# Patient Record
Sex: Male | Born: 1975 | Race: White | Marital: Single | Smoking: Never smoker
Health system: Southern US, Community
[De-identification: ages and names within clinical notes are randomized; demographics above are authoritative.]

## PROBLEM LIST (undated history)

## (undated) DIAGNOSIS — K37 Unspecified appendicitis: Secondary | ICD-10-CM

## (undated) DIAGNOSIS — R569 Unspecified convulsions: Secondary | ICD-10-CM

## (undated) HISTORY — PX: APPENDECTOMY: SHX54

---

## 2015-08-24 ENCOUNTER — Encounter: Payer: Self-pay | Admitting: Emergency Medicine

## 2015-08-24 ENCOUNTER — Emergency Department
Admission: EM | Admit: 2015-08-24 | Discharge: 2015-08-24 | Disposition: A | Discharge status: Home / Self Care | Payer: Self-pay | Attending: Emergency Medicine | Admitting: Emergency Medicine

## 2015-08-24 ENCOUNTER — Other Ambulatory Visit: Payer: Self-pay

## 2015-08-24 ENCOUNTER — Emergency Department: Payer: Self-pay

## 2015-08-24 DIAGNOSIS — R079 Chest pain, unspecified: Secondary | ICD-10-CM | POA: Insufficient documentation

## 2015-08-24 HISTORY — DX: Unspecified appendicitis: K37

## 2015-08-24 HISTORY — DX: Unspecified convulsions: R56.9

## 2015-08-24 LAB — CBC
HCT: 45.5 % (ref 40.0–52.0)
Hemoglobin: 15.8 g/dL (ref 13.0–18.0)
MCH: 31 pg (ref 26.0–34.0)
MCHC: 34.6 g/dL (ref 32.0–36.0)
MCV: 89.6 fL (ref 80.0–100.0)
PLATELETS: 176 10*3/uL (ref 150–440)
RBC: 5.08 MIL/uL (ref 4.40–5.90)
RDW: 13.6 % (ref 11.5–14.5)
WBC: 9 10*3/uL (ref 3.8–10.6)

## 2015-08-24 LAB — BASIC METABOLIC PANEL
Anion gap: 7 (ref 5–15)
BUN: 14 mg/dL (ref 6–20)
CHLORIDE: 106 mmol/L (ref 101–111)
CO2: 26 mmol/L (ref 22–32)
CREATININE: 0.96 mg/dL (ref 0.61–1.24)
Calcium: 9.1 mg/dL (ref 8.9–10.3)
GFR calc Af Amer: >60 mL/min (ref >60)
GFR calc non Af Amer: >60 mL/min (ref >60)
GLUCOSE: 114 mg/dL — AB (ref 65–99)
Potassium: 4.1 mmol/L (ref 3.5–5.1)
SODIUM: 139 mmol/L (ref 135–145)

## 2015-08-24 LAB — TROPONIN I
Troponin I: <0.03 ng/mL (ref <0.031)
Troponin I: <0.03 ng/mL (ref <0.031)

## 2015-08-24 MED ORDER — ASPIRIN 81 MG PO CHEW
81.0000 mg | CHEWABLE_TABLET | Freq: Once | ORAL | Status: AC
  Administered 2015-08-24: 81 mg via ORAL
  Filled 2015-08-24: qty 1

## 2015-08-24 NOTE — ED Provider Notes (Signed)
[facility] Emergency Department Provider Note  ____________________________________________  Time seen: Approximately 12:31 PM  I have reviewed the triage vital signs and the nursing notes.   HISTORY  Chief Complaint Chest Pain    HPI Rachit Grim is a 40 y.o. male patient reports 3 or 4 weeks ago he had an episode of discomfort in his lower chest pressure-like little bit in the left shoulder and left arm lasted a few days and one away to come and go in intensity started again yesterday got better woke up with it this morning a little bit worse can feel some palpitations got better when he got here. Was not worse with movement of his body is not worse with exercise he had no sweating nausea or shortness of breath no pleuritic chest pain lasted a good part of the day yesterday and for several hours this morning. Mild in nature vague kind of pressure feeling not sharp or stabbing very heavy.  Past Medical History  Diagnosis Date  . Seizures (HCC)   . Appendicitis     There are no active problems to display for this patient.   Past Surgical History  Procedure Laterality Date  . Appendectomy      Current Outpatient Rx  Name  Route  Sig  Dispense  Refill  . ibuprofen (ADVIL,MOTRIN) 200 MG tablet   Oral   Take 400-600 mg by mouth every 6 (six) hours as needed for mild pain or moderate pain.           Allergies Review of patient's allergies indicates no known allergies.  History reviewed. No pertinent family history.  Social History Social History  Substance Use Topics  . Smoking status: Never Smoker   . Smokeless tobacco: Never Used  . Alcohol Use: No   family history;  Mother has had cardiac disease wife died of cardiac arrest  Review of Systems Constitutional: No fever/chills Eyes: No visual changes. ENT: No sore throat. Cardiovascular: See history of present illness Respiratory: Denies shortness of breath. Gastrointestinal: No  abdominal pain.  No nausea, no vomiting.  No diarrhea.  No constipation. Genitourinary: Negative for dysuria. Musculoskeletal: Negative for back pain. Skin: Negative for rash. Neurological: Negative for headaches, focal weakness or numbness.  10-point ROS otherwise negative.  ____________________________________________   PHYSICAL EXAM:  VITAL SIGNS: ED Triage Vitals  Enc Vitals Group     BP 08/24/15 1007 144/86 mmHg     Pulse Rate 08/24/15 1007 88     Resp 08/24/15 1007 18     Temp 08/24/15 1007 97.4 F (36.3 C)     Temp Source 08/24/15 1007 Oral     SpO2 08/24/15 1007 98 %     Weight 08/24/15 1007 160 lb (72.576 kg)     Height 08/24/15 1007  (1.905 m)     Head Cir --      Peak Flow --      Pain Score 08/24/15 1014 4     Pain Loc --      Pain Edu? --      Excl. in GC? --     Constitutional: Alert and oriented. Well appearing and in no acute distress. Eyes: Conjunctivae are normal. PERRL. EOMI. Head: Atraumatic. Nose: No congestion/rhinnorhea. Mouth/Throat: Mucous membranes are moist.  Oropharynx non-erythematous. Neck: No stridor.   Cardiovascular: Normal rate, regular rhythm. Grossly normal heart sounds.  Good peripheral circulation. Respiratory: Normal respiratory effort.  No retractions. Lungs CTAB. Gastrointestinal: Soft and nontender. No distention. No  abdominal bruits. No CVA tenderness. Musculoskeletal: No lower extremity tenderness nor edema.  No joint effusions. Neurologic:  Normal speech and language. No gross focal neurologic deficits are appreciated.  Skin:  Skin is warm, dry and intact. No rash noted.   ____________________________________________   LABS (all labs ordered are listed, but only abnormal results are displayed)  Labs Reviewed  BASIC METABOLIC PANEL - Abnormal; Notable for the following:    Glucose, Bld 114 (*)    All other components within normal limits  CBC  TROPONIN I  TROPONIN I    ____________________________________________  EKG  EKG read and interpreted by me normal sinus rhythm rate of 99 normal axis. His reading possible left atrial enlargement which could be true EKG looks otherwise normal ____________________________________________  RADIOLOGY  Chest x-ray read by radiology and reviewed by me is normal ____________________________________________   PROCEDURES    ____________________________________________   INITIAL IMPRESSION / ASSESSMENT AND PLAN / ED COURSE  Pertinent labs & imaging results that were available during my care of the patient were reviewed by me and considered in my medical decision making (see chart for details).   ____________________________________________   FINAL CLINICAL IMPRESSION(S) / ED DIAGNOSES  Final diagnoses:  Chest pain, unspecified chest pain type      Arnaldo Natal, MD 08/26/15 [phone]

## 2015-08-24 NOTE — ED Notes (Signed)
Pt c/o chest pain that started last night. Pt states pain and tingling radiates down his L arm at this time. Pt states pain with palpation "in some places". NAD noted at this time. Skin warm, dry and intact. Pt alert and oriented at this time.

## 2015-08-24 NOTE — ED Notes (Signed)
Pt not in ED room. Will assess pt upon return from xray.

## 2015-08-24 NOTE — ED Notes (Signed)
Report received 

## 2015-08-25 ENCOUNTER — Telehealth: Payer: Self-pay

## 2015-08-25 NOTE — Telephone Encounter (Signed)
l mom to schedule ED f/u 

## 2015-08-25 NOTE — Telephone Encounter (Signed)
This encounter was created in error - please disregard.

## 2018-02-16 ENCOUNTER — Emergency Department
Admission: EM | Admit: 2018-02-16 | Discharge: 2018-02-17 | Disposition: A | Discharge status: Home / Self Care | Payer: Self-pay | Attending: Emergency Medicine | Admitting: Emergency Medicine

## 2018-02-16 ENCOUNTER — Emergency Department: Payer: Self-pay

## 2018-02-16 DIAGNOSIS — R0789 Other chest pain: Secondary | ICD-10-CM | POA: Insufficient documentation

## 2018-02-16 LAB — CBC
HEMATOCRIT: 47.1 % (ref 40.0–52.0)
Hemoglobin: 16.4 g/dL (ref 13.0–18.0)
MCH: 31.7 pg (ref 26.0–34.0)
MCHC: 34.8 g/dL (ref 32.0–36.0)
MCV: 91.2 fL (ref 80.0–100.0)
PLATELETS: 209 10*3/uL (ref 150–440)
RBC: 5.17 MIL/uL (ref 4.40–5.90)
RDW: 13.1 % (ref 11.5–14.5)
WBC: 8.1 10*3/uL (ref 3.8–10.6)

## 2018-02-16 LAB — BASIC METABOLIC PANEL
Anion gap: 8 (ref 5–15)
BUN: 20 mg/dL (ref 6–20)
CHLORIDE: 105 mmol/L (ref 98–111)
CO2: 25 mmol/L (ref 22–32)
Calcium: 9.3 mg/dL (ref 8.9–10.3)
Creatinine, Ser: 1.1 mg/dL (ref 0.61–1.24)
GFR calc non Af Amer: >60 mL/min (ref >60)
Glucose, Bld: 102 mg/dL — ABNORMAL HIGH (ref 70–99)
POTASSIUM: 3.5 mmol/L (ref 3.5–5.1)
Sodium: 138 mmol/L (ref 135–145)

## 2018-02-16 LAB — TROPONIN I: Troponin I: <0.03 ng/mL (ref <0.03)

## 2018-02-16 NOTE — ED Triage Notes (Signed)
Patient c/o left chest chest pain radiating to left arm and back X 1 week. Patient c/o fatigue and SOB, denies any other accompanying symptoms.

## 2018-02-17 ENCOUNTER — Other Ambulatory Visit: Payer: Self-pay

## 2018-02-17 LAB — TROPONIN I

## 2018-02-17 NOTE — Discharge Instructions (Signed)
Is unclear why you have been having chest pain, certainly some of it could be anxiety but we cannot diagnose you with that in the emergency room.  We do feel that you have multiple stressors and we do advise you follow with someone to talk about that.  We have given you a referral for that.  However, we are more preoccupied by your chest pain.  You would prefer not to be admitted to the hospital which is certainly your choice however does limit our ability to further evaluate this.  Is been going on now for several years and therefore I think it is time to have it definitively analyzed by cardiology.  As you would prefer not to be admitted we will do this as an outpatient, please follow closely with cardiologist on Monday for stress test.  Return to the emergency room in the meantime if you change your mind about admission or have any new or worrisome symptoms including chest pain that is worse, shortness of breath or you have any other concerns.  While your  work-up in the emergency room is reassuring, and your pain is been going on for a long time, there is no way we can definitively tell you that you not having heart disease, therefore, if you have any change in symptoms or you change your mind about admission we would like you to return immediately to the ER we will be happy to take care of you at that time.  Significant symptoms call 911.  Otherwise, do not feel to follow-up with cardiology please

## 2018-02-17 NOTE — ED Provider Notes (Addendum)
[facility] Emergency Department Provider Note  ____________________________________________   I have reviewed the triage vital signs and the nursing notes. Where available I have reviewed prior notes and, if possible and indicated, outside hospital notes.    HISTORY  Chief Complaint Chest Pain    HPI Logan Thompson is a 42 y.o. male with no significant past medical history, specifically no history of high cholesterol or other known cancer risk factors, mother did have heart disease however.  Patient has been having left-sided chest pain and occasional tingling in his fingers on both sides but more on the left than the right, for several years.  Is been worse over the last 2 months.  Is not necessarily exertional.  He can often mow the lawn without any symptoms.  Sometimes it happens when he is himself and sometimes it happens at rest.  Patient believes is a stress.  He has been seen here for this a few years ago as well.  On further talking to the patient about his stress he states that his wife died of a heart attack on 5 August 4 years ago, and it was just about this time this year at that anniversary that he began noticing his chronic chest pain more.  Patient states that he is not having pain at this time.  Sometimes he takes an Advil and goes away.  He has no personal family history of PE or DVT.  No recent travel, no leg swelling, no recent surgery, no pleuritic chest pain, he does not feel short of breath at this time and he has not been feeling short of breath.  Patient's initial heart rate was in the 170s he states he was very anxious when he came in but he is feeling more calm now.  He has no SI no HI.  He states he would never harm himself.  He is however unemployed and has many stressors in his life.  He lives with his mother who is an employee here.   Pain, was present, as a tightness, nothing makes it worse that he can think of, it is better when he takes  Tylenol or aspirin, is been going on for 2 years off and on worse since his anniversary of his wife's death, he states that it is not pleuritic, sometimes he does have left arm discomfort, he states he feels that this is anxiety, he has had no syncopal episodes.  The patient states his pain is worse when he thinks about his wife's passing.  No other alleviating or aggravating factors, no other prior treatment.  Has never seen a cardiologist for this.  Patient is adamant that he does not wish to be admitted to the hospital.  He just wants to receive "ER tests" and if those are negative he would like to go home.  He does agree to follow-up.  Past Medical History:  Diagnosis Date  . Appendicitis   . Seizures (HCC)     There are no active problems to display for this patient.   Past Surgical History:  Procedure Laterality Date  . APPENDECTOMY      Prior to Admission medications   Medication Sig Start Date End Date Taking? Authorizing Provider  ibuprofen (ADVIL,MOTRIN) 200 MG tablet Take 400-600 mg by mouth every 6 (six) hours as needed for mild pain or moderate pain.    [provider]    Allergies Patient has no known allergies.  No family history on file.  Social History Social  History   Tobacco Use  . Smoking status: Never Smoker  . Smokeless tobacco: Never Used  Substance Use Topics  . Alcohol use: No  . Drug use: No    Review of Systems Constitutional: No fever/chills Eyes: No visual changes. ENT: No sore throat. No stiff neck no neck pain Cardiovascular: See HPI regarding chest pain. Respiratory: Denies shortness of breath. Gastrointestinal:   no vomiting.  No diarrhea.  No constipation. Genitourinary: Negative for dysuria. Musculoskeletal: Negative lower extremity swelling Skin: Negative for rash. Neurological: Negative for severe headaches, focal weakness or numbness.   ____________________________________________   PHYSICAL EXAM:  VITAL SIGNS: ED  Triage Vitals  Enc Vitals Group     BP 02/16/18 2242 (!) 135/92     Pulse Rate 02/16/18 2242 (!) 116     Resp 02/16/18 2242 18     Temp 02/16/18 2242 98.7 F (37.1 C)     Temp Source 02/16/18 2242 Oral     SpO2 02/16/18 2242 99 %     Weight 02/16/18 2243 163 lb (73.9 kg)     Height 02/16/18 2243 6\' 3"  (1.905 m)     Head Circumference --      Peak Flow --      Pain Score 02/16/18 2243 4     Pain Loc --      Pain Edu? --      Excl. in GC? --     Constitutional: Alert and oriented. Well appearing and in no acute distress. Eyes: Conjunctivae are normal Head: Atraumatic HEENT: No congestion/rhinnorhea. Mucous membranes are moist.  Oropharynx non-erythematous Neck:   Nontender with no meningismus, no masses, no stridor Cardiovascular: Normal rate, regular rhythm. Grossly normal heart sounds.  Good peripheral circulation. Respiratory: Normal respiratory effort.  No retractions. Lungs CTAB. Abdominal: Soft and nontender. No distention. No guarding no rebound Back:  There is no focal tenderness or step off.  there is no midline tenderness there are no lesions noted. there is no CVA tenderness Musculoskeletal: No lower extremity tenderness, no upper extremity tenderness. No joint effusions, no DVT signs strong distal pulses no edema Neurologic:  Normal speech and language. No gross focal neurologic deficits are appreciated.  Skin:  Skin is warm, dry and intact. No rash noted. Psychiatric: Mood and affect are anxious and ruminative. Speech and behavior are normal.  ____________________________________________   LABS (all labs ordered are listed, but only abnormal results are displayed)  Labs Reviewed  BASIC METABOLIC PANEL - Abnormal; Notable for the following components:      Result Value   Glucose, Bld 102 (*)    All other components within normal limits  CBC  TROPONIN I    Pertinent labs  results that were available during my care of the patient were reviewed by me and  considered in my medical decision making (see chart for details). ____________________________________________  EKG  I personally interpreted any EKGs ordered by me or triage EKGs were performed, the first EKG showed sinus tach rate 117, possible LAE, no acute ST elevation or depression nonspecific ST changes no significant change from August 07, 2015, Second EKG, when patient was in the room and will relax, sinus rhythm rate 78, normal axis no acute ST elevation or depression, borderline LVH, no acute ____________________________________________  RADIOLOGY  Pertinent labs & imaging results that were available during my care of the patient were reviewed by me and considered in my medical decision making (see chart for details). If possible, patient and/or family made aware  of any abnormal findings.  Dg Chest 2 View  Result Date: 02/16/2018 CLINICAL DATA:  Left chest pain radiating to the left arm and back for 1 week. Fatigue and shortness of breath. EXAM: CHEST - 2 VIEW COMPARISON:  08/24/2015 FINDINGS: The heart size and mediastinal contours are within normal limits. Both lungs are clear. The visualized skeletal structures are unremarkable. IMPRESSION: No active cardiopulmonary disease. Electronically Signed   By: Burman Nieves M.D.   On: 02/16/2018 23:08   ____________________________________________    PROCEDURES  Procedure(s) performed: None  Procedures  Critical Care performed: None  ____________________________________________   INITIAL IMPRESSION / ASSESSMENT AND PLAN / ED COURSE  Pertinent labs & imaging results that were available during my care of the patient were reviewed by me and considered in my medical decision making (see chart for details).  Patient here with atypical and chronic recurrent chest pain which is been made worse by his thoughtfulness about his wife's anniversary of her passing from heart disease.  Patient states he was very stressed and anxious  when he came in he is now calmer.  Initially his heart rate was 117 however after sitting in the bed for a few minutes and relaxing his heart rate is now down to the 70s and 80s, there is a broad differential for chronic recurrent chest pain, ACS certainly is a possibility but his troponins are negative despite a month of discomfort, EKG does not show any acute change to my read and he is having somewhat atypical symptoms.  They happen at rest and they happen when he is thinking about his wife more than at any other time.  PE is a possibility but he has no risk factors and I do not believe his tachycardia on presentation was secondary to anything but as he describes his anxiety.  I do not see any strong likelihood these been having pulmonary emboli for 2 years which get worse at anniversary of his wife's passing.  I do not think emergent CT scan is indicated.  Chest x-ray is reassuring, and now that he is more relaxed, his vital signs are reassuring.  Did offer him admission to the hospital for definitive care given the limitations of our work-up in the emergency room but he refuses.  He understands the risk benefits alternative to refusing.  He states that it is his strong preference to go home.  We will send a second set of cardiac enzymes.  Patient does understand that there are limitations to an ER work-up for chest pain, and that if he declines admission as he is, there is certainly some risk to him although given the chronicity of the complaint the risk is perhaps somewhat mitigated.  He is very comfortable with this.  We will send a second set of markers and reassess.  ----------------------------------------- 1:22 AM on 02/17/2018 -----------------------------------------  he does not want to stay for second troponin he would like to go home I did prevail upon however to wait another 15 minutes for the second troponin which we draw a little early as he is eager to go home.  He still refuses admission  to the hospital, his mother, who is at bedside and known to [place] feels very strongly this is just his anxiety acting up, he declines counseling and if his troponin is negative we will discharge him in his own request.  I did discuss the possibility, but as it is of dissection, PE myocarditis endocarditis etc. and the possibility of further imaging but  he declines and I do not think is unreasonable.  ----------------------------------------- 2:09 AM on 02/17/2018 -----------------------------------------  At this time, there does not appear to be clinical evidence to support the diagnosis of pulmonary embolus, dissection, myocarditis, endocarditis, pericarditis, pericardial tamponade, acute coronary syndrome, pneumothorax, pneumonia, or any other acute intrathoracic pathology that will require admission or acute intervention. Nor is there evidence of any significant intra-abdominal pathology causing this discomfort.  Patient does not appear likely to decompensate the next 2 days as he declines admission we will discharge him.  Return precautions and follow-up given and understood   ____________________________________________   FINAL CLINICAL IMPRESSION(S) / ED DIAGNOSES  Final diagnoses:  None      This chart was dictated using voice recognition software.  Despite best efforts to proofread,  errors can occur which can change meaning.       Jeanmarie Plant, MD 02/17/18 0045    Jeanmarie Plant, MD 02/17/18 1610    Jeanmarie Plant, MD 02/17/18 [phone]

## 2018-02-21 ENCOUNTER — Telehealth: Payer: Self-pay

## 2018-02-21 NOTE — Telephone Encounter (Signed)
Spoke to patient mother who states she will call us back with a phone number for patient We are needing to contact him to schedule ED fu  Seen on 02/16/18 for CP

## 2018-02-27 NOTE — Telephone Encounter (Signed)
Lmov for patient to call back and schedule appointment ° ° °

## 2018-03-01 NOTE — Telephone Encounter (Signed)
Lmov for patient to call and schedule °

## 2018-03-03 NOTE — Telephone Encounter (Signed)
Unable to [employer]  Sending letter

## 2019-11-08 IMAGING — CR DG CHEST 2V
2 series · 2 of 2 positions shown · non-contrast
Comparison: 08/24/2015

CLINICAL DATA: Left chest pain radiating to the left arm and back
for 1 week. Fatigue and shortness of breath.

EXAM:
CHEST - 2 VIEW

[chest pa]
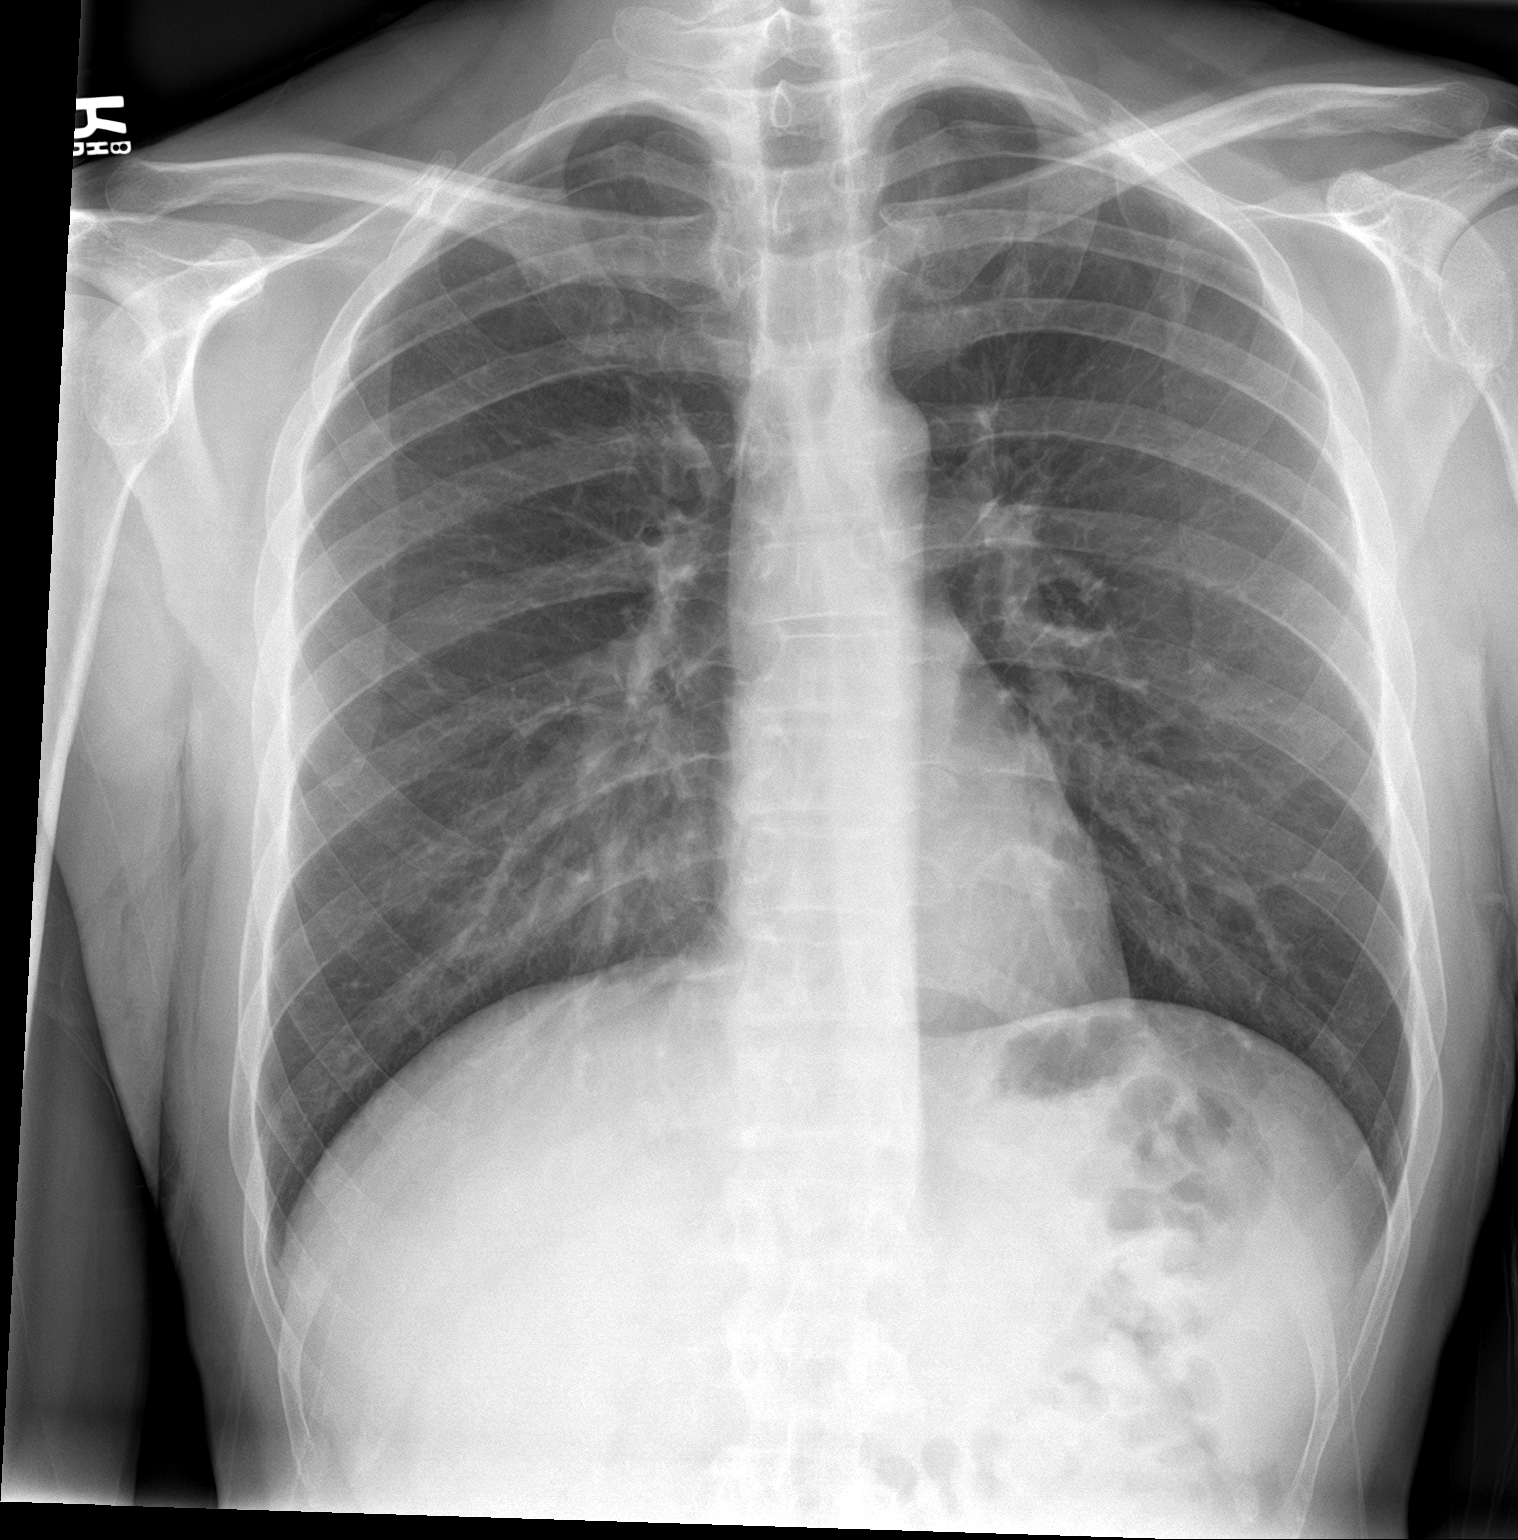

[chest lat]
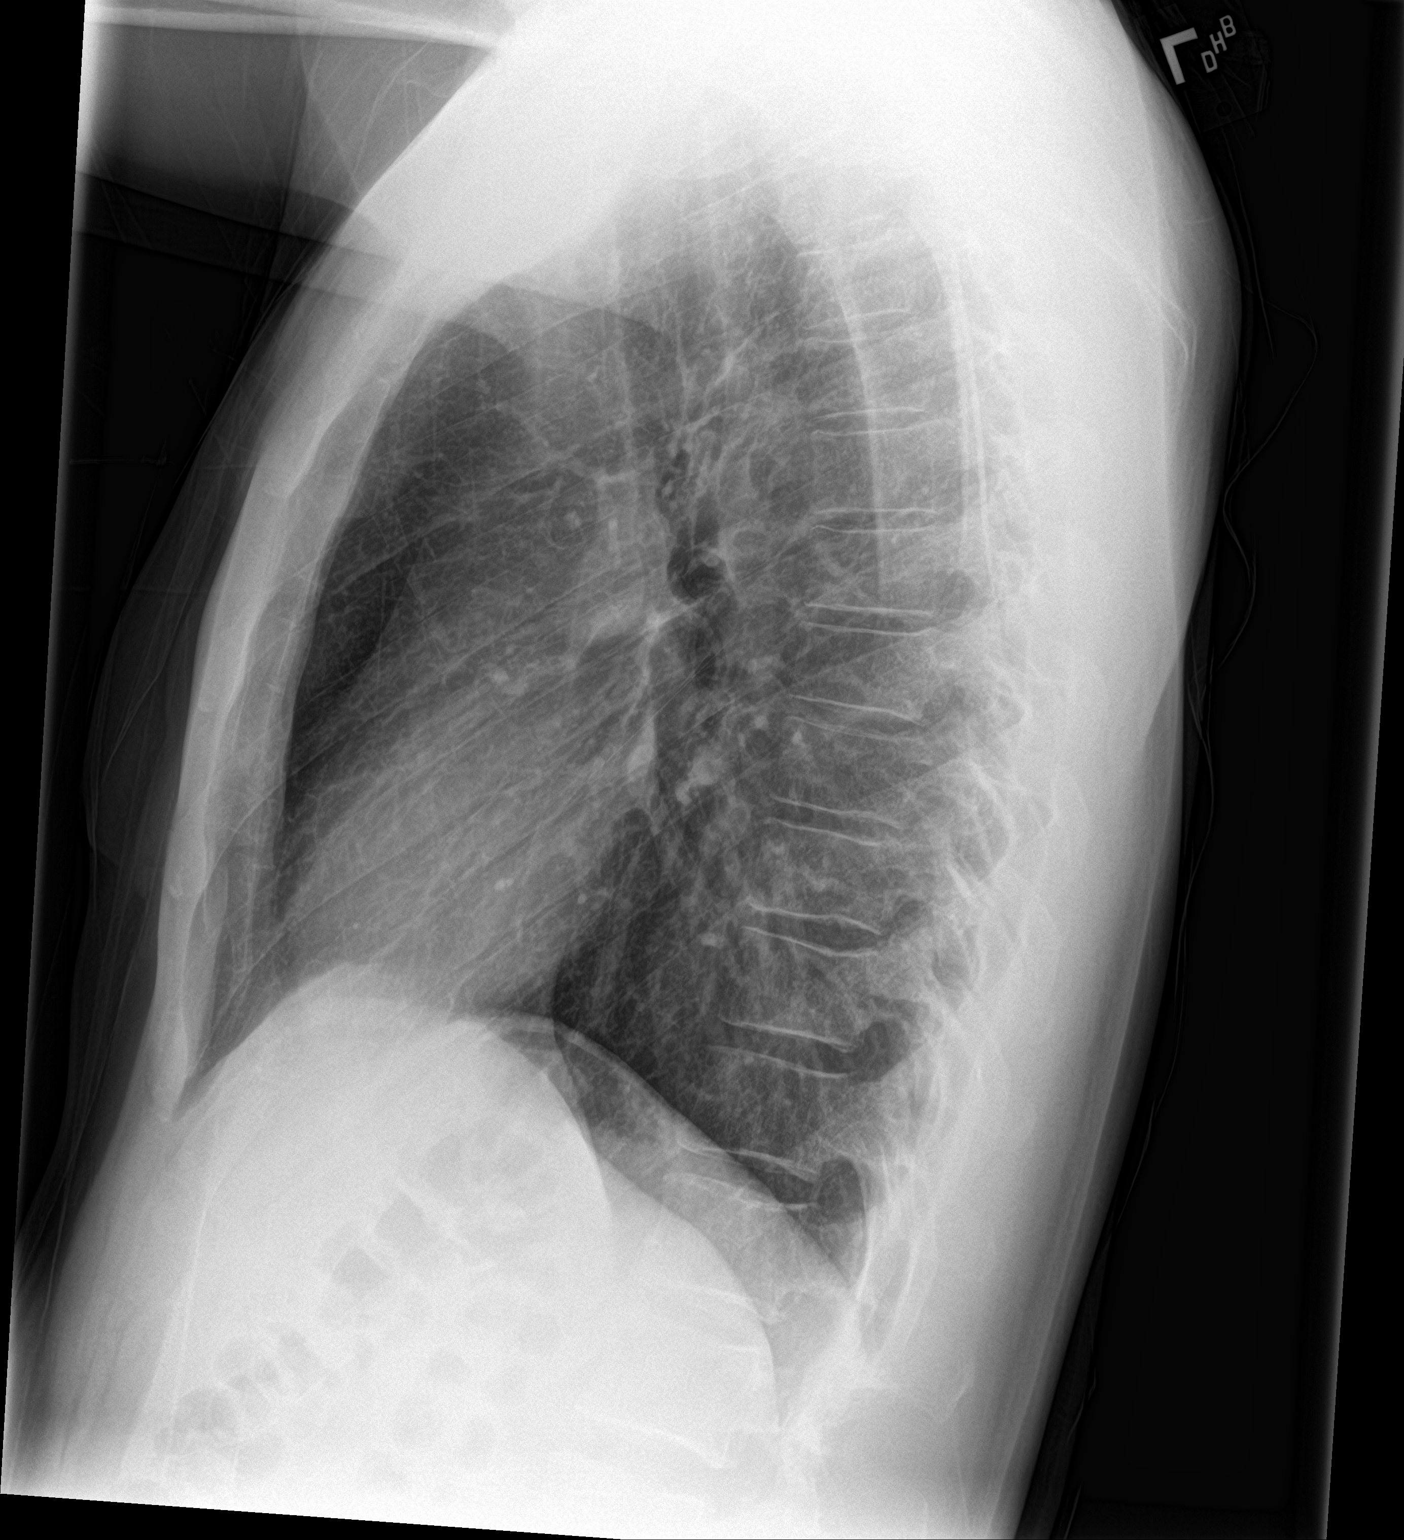

[2 of 2 positions shown; findings below may reference images not displayed]

FINDINGS: The heart size and mediastinal contours are within normal limits.
Both lungs are clear. The visualized skeletal structures are
unremarkable.
IMPRESSION: No active cardiopulmonary disease.

## 2024-02-26 ENCOUNTER — Emergency Department: Payer: Self-pay

## 2024-02-26 ENCOUNTER — Emergency Department
Admission: EM | Admit: 2024-02-26 | Discharge: 2024-02-26 | Disposition: A | Discharge status: Home / Self Care | Payer: Self-pay | Attending: Emergency Medicine | Admitting: Emergency Medicine

## 2024-02-26 ENCOUNTER — Other Ambulatory Visit: Payer: Self-pay

## 2024-02-26 DIAGNOSIS — Y92410 Unspecified street and highway as the place of occurrence of the external cause: Secondary | ICD-10-CM | POA: Diagnosis not present

## 2024-02-26 DIAGNOSIS — S60211A Contusion of right wrist, initial encounter: Secondary | ICD-10-CM | POA: Insufficient documentation
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
